# Patient Record
Sex: Male | Born: 1995 | Race: Black or African American | Hispanic: No | Marital: Single
Health system: Southern US, Community
[De-identification: ages and names within clinical notes are randomized; demographics above are authoritative.]

## PROBLEM LIST (undated history)

## (undated) DIAGNOSIS — J45909 Unspecified asthma, uncomplicated: Secondary | ICD-10-CM

## (undated) DIAGNOSIS — K5900 Constipation, unspecified: Secondary | ICD-10-CM

## (undated) HISTORY — DX: Unspecified asthma, uncomplicated: J45.909

## (undated) HISTORY — DX: Constipation, unspecified: K59.00

---
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
## 2018-03-03 ENCOUNTER — Encounter: Payer: Self-pay | Admitting: Pediatrics

## 2021-01-25 ENCOUNTER — Emergency Department (HOSPITAL_COMMUNITY)
Admission: EM | Admit: 2021-01-25 | Discharge: 2021-01-26 | Disposition: A | Discharge status: Home / Self Care | Payer: No Typology Code available for payment source | Attending: Emergency Medicine | Admitting: Emergency Medicine

## 2021-01-25 DIAGNOSIS — Z23 Encounter for immunization: Secondary | ICD-10-CM | POA: Diagnosis not present

## 2021-01-25 DIAGNOSIS — S60811A Abrasion of right wrist, initial encounter: Secondary | ICD-10-CM | POA: Diagnosis not present

## 2021-01-25 DIAGNOSIS — S50311A Abrasion of right elbow, initial encounter: Secondary | ICD-10-CM | POA: Diagnosis not present

## 2021-01-25 DIAGNOSIS — S6991XA Unspecified injury of right wrist, hand and finger(s), initial encounter: Secondary | ICD-10-CM | POA: Diagnosis present

## 2021-01-25 DIAGNOSIS — T148XXA Other injury of unspecified body region, initial encounter: Secondary | ICD-10-CM

## 2021-01-25 DIAGNOSIS — S80811A Abrasion, right lower leg, initial encounter: Secondary | ICD-10-CM | POA: Insufficient documentation

## 2021-01-25 DIAGNOSIS — Y92410 Unspecified street and highway as the place of occurrence of the external cause: Secondary | ICD-10-CM | POA: Insufficient documentation

## 2021-01-25 MED ORDER — LACTATED RINGERS IV BOLUS
1000.0000 mL | Freq: Once | INTRAVENOUS | Status: AC
  Administered 2021-01-26: 1000 mL via INTRAVENOUS

## 2021-01-25 MED ORDER — BACITRACIN ZINC 500 UNIT/GM EX OINT
TOPICAL_OINTMENT | Freq: Two times a day (BID) | CUTANEOUS | Status: DC
  Administered 2021-01-26: 1 via TOPICAL
  Filled 2021-01-25: qty 0.9

## 2021-01-25 MED ORDER — TETANUS-DIPHTH-ACELL PERTUSSIS 5-2.5-18.5 LF-MCG/0.5 IM SUSY
0.5000 mL | PREFILLED_SYRINGE | Freq: Once | INTRAMUSCULAR | Status: AC
  Administered 2021-01-26: 0.5 mL via INTRAMUSCULAR
  Filled 2021-01-25: qty 0.5

## 2021-01-25 MED ORDER — HYDROMORPHONE HCL 1 MG/ML IJ SOLN
1.0000 mg | Freq: Once | INTRAMUSCULAR | Status: AC
  Administered 2021-01-26: 1 mg via INTRAVENOUS
  Filled 2021-01-25: qty 1
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
## 2021-01-25 NOTE — Progress Notes (Signed)
Chaplain checked in for level. Chaplain is available to follow-up as needed.    01/25/21 2300  Clinical Encounter Type  Visited With Patient not available  Visit Type Initial

## 2021-01-26 ENCOUNTER — Emergency Department (HOSPITAL_COMMUNITY): Payer: No Typology Code available for payment source

## 2021-01-26 MED ORDER — OXYCODONE-ACETAMINOPHEN 5-325 MG PO TABS
1.0000 | ORAL_TABLET | Freq: Once | ORAL | Status: AC
  Administered 2021-01-26: 1 via ORAL
  Filled 2021-01-26: qty 1

## 2021-01-26 MED ORDER — IBUPROFEN 800 MG PO TABS: 800.0000 mg | ORAL_TABLET | Freq: Three times a day (TID) | ORAL | 0 refills | Status: AC

## 2021-01-26 MED ORDER — OXYCODONE-ACETAMINOPHEN 5-325 MG PO TABS: 1.0000 | ORAL_TABLET | Freq: Four times a day (QID) | ORAL | 0 refills | Status: AC | PRN
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
## 2023-08-15 ENCOUNTER — Ambulatory Visit
Admission: EM | Admit: 2023-08-15 | Discharge: 2023-08-15 | Disposition: A | Discharge status: Home / Self Care | Attending: Internal Medicine | Admitting: Internal Medicine

## 2023-08-15 DIAGNOSIS — R112 Nausea with vomiting, unspecified: Secondary | ICD-10-CM

## 2023-08-15 DIAGNOSIS — R1084 Generalized abdominal pain: Secondary | ICD-10-CM

## 2023-08-15 MED ORDER — ONDANSETRON 4 MG PO TBDP
4.0000 mg | ORAL_TABLET | Freq: Once | ORAL | Status: AC
  Administered 2023-08-15: 4 mg via ORAL

## 2023-08-15 MED ORDER — ONDANSETRON 4 MG PO TBDP: 4.0000 mg | ORAL_TABLET | Freq: Three times a day (TID) | ORAL | 0 refills | Status: AC | PRN

## 2023-08-15 NOTE — ED Provider Notes (Signed)
 [facility]    CSN: [id number] Arrival date & time: 08/15/23  1141      History   Chief Complaint No chief complaint on file.   HPI Paul Downs is a 28 y.o. male.   Paul Downs is a 28 y.o. male presenting for chief complaint of nausea and vomiting that started 5 days ago after eating subway. He ate a meatball and pepperoni sub around 11am and started feeling sick at around 6pm the day symptoms started. He had 1-2 episodes of vomiting on the first day of symptoms and has not vomited since then. No diarrhea, he had a small and normal in consistency bowel movement today without blood or mucus to the stool.. Reports decreased appetite, generalized abdominal pain, and persistent nausea. He is drinking fluids without difficulty and has been able to eat cereal and milk without stomach upset. His main concern is mild dizziness with standing up and changing positions. No headache, vision changes, flank pain, urinary symptoms, or fever/chills.  No recent sick contacts with similar symptoms.  Taking aspirin for abdominal pain with minimal relief.     Past Medical History:  Diagnosis Date   Asthma    Unspecified constipation 11/01/2012    Patient Active Problem List   Diagnosis Date Noted   Talus fracture 08/17/2014    History reviewed. No pertinent surgical history.     Home Medications    Prior to Admission medications   Medication Sig Start Date End Date Taking? Authorizing Provider  ondansetron (ZOFRAN-ODT) 4 MG disintegrating tablet Take 1 tablet (4 mg total) by mouth every 8 (eight) hours as needed for nausea or vomiting. 08/15/23  Yes Starlene Eaton, FNP  albuterol (PROVENTIL HFA;VENTOLIN HFA) 108 (90 BASE) MCG/ACT inhaler Inhale 2 puffs into the lungs every 4 (four) hours as needed for wheezing. 11/01/12   Amber Bail, MD  Elastic Bandages & Supports (ACE ARM SLING) MISC 1 Units by Does not apply route as needed. 01/25/16   Gnanasekaran,  Kavithashree, MD  ibuprofen (ADVIL) 800 MG tablet Take 1 tablet (800 mg total) by mouth 3 (three) times daily. 01/26/21   Mesner, Jason, MD  loratadine (CLARITIN) 10 MG tablet Take 1 tablet (10 mg total) by mouth daily. 08/17/14   Merribeth Abbott, MD  oxyCODONE-acetaminophen (PERCOCET) 5-325 MG tablet Take 1 tablet by mouth every 6 (six) hours as needed for severe pain. 01/26/21   Mesner, Reymundo Caulk, MD  polyethylene glycol powder (GLYCOLAX/MIRALAX) powder Take 17 g by mouth daily. 11/01/12   Amber Bail, MD    Family History History reviewed. No pertinent family history.  Social History Social History   Tobacco Use   Smoking status: Passive Smoke Exposure - Never Smoker   Smokeless tobacco: Never  Substance Use Topics   Alcohol use: No    Alcohol/week: 0.0 standard drinks of alcohol   Drug use: No     Allergies   Patient has no known allergies.   Review of Systems Review of Systems Per HPI  Physical Exam Triage Vital Signs ED Triage Vitals  Encounter Vitals Group     BP 08/15/23 1203 131/82     Systolic BP Percentile --      Diastolic BP Percentile --      Pulse Rate 08/15/23 1203 76     Resp 08/15/23 1203 20     Temp 08/15/23 1203 98.1 F (36.7 C)     Temp Source 08/15/23 1203 Oral     SpO2  08/15/23 1203 98 %     Weight --      Height --      Head Circumference --      Peak Flow --      Pain Score 08/15/23 1204 0     Pain Loc --      Pain Education --      Exclude from Growth Chart --    No data found.  Updated Vital Signs BP 131/82 (BP Location: Right Arm)   Pulse 76   Temp 98.1 F (36.7 C) (Oral)   Resp 20   SpO2 98%   Visual Acuity Right Eye Distance:   Left Eye Distance:   Bilateral Distance:    Right Eye Near:   Left Eye Near:    Bilateral Near:     Physical Exam Vitals and nursing note reviewed.  Constitutional:      Appearance: He is not ill-appearing or toxic-appearing.  HENT:     Head: Normocephalic and atraumatic.     Right Ear:  Hearing and external ear normal.     Left Ear: Hearing and external ear normal.     Nose: Nose normal.     Mouth/Throat:     Lips: Pink.  Eyes:     General: Lids are normal. Vision grossly intact. Gaze aligned appropriately.     Extraocular Movements: Extraocular movements intact.     Conjunctiva/sclera: Conjunctivae normal.  Cardiovascular:     Rate and Rhythm: Normal rate and regular rhythm.     Heart sounds: Normal heart sounds, S1 normal and S2 normal.  Pulmonary:     Effort: Pulmonary effort is normal. No respiratory distress.     Breath sounds: Normal breath sounds and air entry.  Abdominal:     General: Abdomen is flat. Bowel sounds are normal. There is no distension.     Palpations: Abdomen is soft. There is no mass.     Tenderness: There is generalized abdominal tenderness. There is no right CVA tenderness, left CVA tenderness, guarding or rebound. Negative signs include Murphy's sign and McBurney's sign.  Musculoskeletal:     Cervical back: Neck supple.  Skin:    General: Skin is warm and dry.     Capillary Refill: Capillary refill takes less than 2 seconds.     Findings: No rash.  Neurological:     General: No focal deficit present.     Mental Status: He is alert and oriented to person, place, and time. Mental status is at baseline.     Cranial Nerves: No dysarthria or facial asymmetry.  Psychiatric:        Mood and Affect: Mood normal.        Speech: Speech normal.        Behavior: Behavior normal.        Thought Content: Thought content normal.        Judgment: Judgment normal.      UC Treatments / Results  Labs (all labs ordered are listed, but only abnormal results are displayed) Labs Reviewed - No data to display  EKG   Radiology No results found.  Procedures Procedures (including critical care time)  Medications Ordered in UC Medications  ondansetron (ZOFRAN-ODT) disintegrating tablet 4 mg (4 mg Oral Given 08/15/23 1206)    Initial Impression  / Assessment and Plan / UC Course  I have reviewed the triage vital signs and the nursing notes.  Pertinent labs & imaging results that were available during my care of the  patient were reviewed by me and considered in my medical decision making (see chart for details).   1.  Nausea and vomiting, generalized abdominal pain Evaluation suggests viral gastrointestinal illness etiology.  Patient nontoxic appearing with hemodynamically stable vital signs, abdominal exam without peritoneal signs/focal tenderness. Will manage this with antiemetic (Zofran) as needed, OTC medicines as needed for discomfort/pain, increased fluids, and rest. Zofran 4 mg ODT given in clinic with relief of nausea prior to discharge.  He does not appear to be clinically dehydrated on exam. Liquid/bland diet initially, then increase diet as tolerated.   Counseled patient on potential for adverse effects with medications prescribed/recommended today, strict ER and return-to-clinic precautions discussed, patient verbalized understanding.    Final Clinical Impressions(s) / UC Diagnoses   Final diagnoses:  Nausea and vomiting, unspecified vomiting type  Generalized abdominal pain     Discharge Instructions      Your evaluation suggests that your symptoms are most likely due to viral stomach illness (gastroenteritis/"stomach bug") which will improve on its own with rest and fluids in the next few days.   Take zofran to help with nausea every 8 hours as needed. You may use over the counter medicines for aches and pains such as tylenol as needed.  Start sipping on liquids (broth, water, gatorade, etc). If you are able to keep liquids down without vomiting for 1-2 hours, you may eat bland foods like jello, pudding, applesauce, bananas, rice, and white toast. Once you can tolerate blands, you may return to normal diet.   Pedialyte or gatorolyte may help to prevent/fix dehydration due to vomiting and diarrhea.  Please  follow up with your primary care provider for further management. Return if you experience worsening or uncontrolled pain, inability to tolerate fluids by mouth, difficulty breathing, fevers 100.45F or greater, recurrent vomiting, or any other concerning symptoms.     ED Prescriptions     Medication Sig Dispense Auth. Provider   ondansetron (ZOFRAN-ODT) 4 MG disintegrating tablet Take 1 tablet (4 mg total) by mouth every 8 (eight) hours as needed for nausea or vomiting. 20 tablet Starlene Eaton, FNP      PDMP not reviewed this encounter.   Starlene Eaton, [place] 08/15/23 1642

## 2023-08-15 NOTE — ED Triage Notes (Signed)
 Pt reports after eating subway he began to feel sick x 5 days ago. Pt reports nausea, dizzy, sweats, chills, headache, and no appetite.   Was able to eat a bowel of cereal last night

## 2023-08-15 NOTE — Discharge Instructions (Signed)
